# Patient Record
Sex: Female | Born: 1945 | Race: White | Hispanic: No | Smoking: Never smoker
Health system: Southern US, Community
[De-identification: ages and names within clinical notes are randomized; demographics above are authoritative.]

## PROBLEM LIST (undated history)

## (undated) DIAGNOSIS — F0280 Dementia in other diseases classified elsewhere without behavioral disturbance: Secondary | ICD-10-CM

## (undated) DIAGNOSIS — I10 Essential (primary) hypertension: Secondary | ICD-10-CM

## (undated) DIAGNOSIS — E7800 Pure hypercholesterolemia, unspecified: Secondary | ICD-10-CM

---

## 2019-04-29 ENCOUNTER — Other Ambulatory Visit: Payer: Self-pay

## 2019-04-29 ENCOUNTER — Emergency Department (HOSPITAL_BASED_OUTPATIENT_CLINIC_OR_DEPARTMENT_OTHER): Payer: Medicare Other

## 2019-04-29 ENCOUNTER — Emergency Department (HOSPITAL_BASED_OUTPATIENT_CLINIC_OR_DEPARTMENT_OTHER)
Admission: EM | Admit: 2019-04-29 | Discharge: 2019-04-29 | Disposition: A | Discharge status: Home / Self Care | Payer: Medicare Other | Attending: Emergency Medicine | Admitting: Emergency Medicine

## 2019-04-29 ENCOUNTER — Encounter (HOSPITAL_BASED_OUTPATIENT_CLINIC_OR_DEPARTMENT_OTHER): Payer: Self-pay | Admitting: Emergency Medicine

## 2019-04-29 DIAGNOSIS — I10 Essential (primary) hypertension: Secondary | ICD-10-CM | POA: Diagnosis not present

## 2019-04-29 DIAGNOSIS — G309 Alzheimer's disease, unspecified: Secondary | ICD-10-CM | POA: Insufficient documentation

## 2019-04-29 DIAGNOSIS — F0281 Dementia in other diseases classified elsewhere with behavioral disturbance: Secondary | ICD-10-CM | POA: Insufficient documentation

## 2019-04-29 DIAGNOSIS — R4689 Other symptoms and signs involving appearance and behavior: Secondary | ICD-10-CM

## 2019-04-29 DIAGNOSIS — R4182 Altered mental status, unspecified: Secondary | ICD-10-CM | POA: Diagnosis present

## 2019-04-29 HISTORY — DX: Essential (primary) hypertension: I10

## 2019-04-29 HISTORY — DX: Dementia in other diseases classified elsewhere, unspecified severity, without behavioral disturbance, psychotic disturbance, mood disturbance, and anxiety: F02.80

## 2019-04-29 HISTORY — DX: Pure hypercholesterolemia, unspecified: E78.00

## 2019-04-29 LAB — URINALYSIS, MICROSCOPIC (REFLEX): WBC, UA: NONE SEEN WBC/hpf (ref 0–5)

## 2019-04-29 LAB — CBC WITH DIFFERENTIAL/PLATELET
Abs Immature Granulocytes: 0.03 10*3/uL (ref 0.00–0.07)
Basophils Absolute: 0.1 10*3/uL (ref 0.0–0.1)
Basophils Relative: 1 %
Eosinophils Absolute: 0.1 10*3/uL (ref 0.0–0.5)
Eosinophils Relative: 1 %
HCT: 40.2 % (ref 36.0–46.0)
Hemoglobin: 12.9 g/dL (ref 12.0–15.0)
Immature Granulocytes: 0 %
Lymphocytes Relative: 34 %
Lymphs Abs: 3.2 10*3/uL (ref 0.7–4.0)
MCH: 29.7 pg (ref 26.0–34.0)
MCHC: 32.1 g/dL (ref 30.0–36.0)
MCV: 92.4 fL (ref 80.0–100.0)
Monocytes Absolute: 0.8 10*3/uL (ref 0.1–1.0)
Monocytes Relative: 9 %
Neutro Abs: 5.3 10*3/uL (ref 1.7–7.7)
Neutrophils Relative %: 55 %
Platelets: 236 10*3/uL (ref 150–400)
RBC: 4.35 MIL/uL (ref 3.87–5.11)
RDW: 13.2 % (ref 11.5–15.5)
WBC: 9.6 10*3/uL (ref 4.0–10.5)
nRBC: 0 % (ref 0.0–0.2)

## 2019-04-29 LAB — URINALYSIS, ROUTINE W REFLEX MICROSCOPIC
Bilirubin Urine: NEGATIVE
Glucose, UA: NEGATIVE mg/dL
Ketones, ur: NEGATIVE mg/dL
Leukocytes,Ua: NEGATIVE
Nitrite: NEGATIVE
Protein, ur: NEGATIVE mg/dL
Specific Gravity, Urine: 1.01 (ref 1.005–1.030)
pH: 7 (ref 5.0–8.0)

## 2019-04-29 LAB — VALPROIC ACID LEVEL: Valproic Acid Lvl: 32 ug/mL — ABNORMAL LOW (ref 50.0–100.0)

## 2019-04-29 LAB — COMPREHENSIVE METABOLIC PANEL
ALT: 15 U/L (ref 0–44)
AST: 18 U/L (ref 15–41)
Albumin: 3.9 g/dL (ref 3.5–5.0)
Alkaline Phosphatase: 68 U/L (ref 38–126)
Anion gap: 9 (ref 5–15)
BUN: 14 mg/dL (ref 8–23)
CO2: 26 mmol/L (ref 22–32)
Calcium: 9.3 mg/dL (ref 8.9–10.3)
Chloride: 107 mmol/L (ref 98–111)
Creatinine, Ser: 0.84 mg/dL (ref 0.44–1.00)
GFR calc Af Amer: >60 mL/min (ref >60)
GFR calc non Af Amer: >60 mL/min (ref >60)
Glucose, Bld: 130 mg/dL — ABNORMAL HIGH (ref 70–99)
Potassium: 4.2 mmol/L (ref 3.5–5.1)
Sodium: 142 mmol/L (ref 135–145)
Total Bilirubin: 0.6 mg/dL (ref 0.3–1.2)
Total Protein: 6.7 g/dL (ref 6.5–8.1)

## 2019-04-29 LAB — AMMONIA: Ammonia: 15 umol/L (ref 9–35)

## 2019-04-29 MED ORDER — ZIPRASIDONE MESYLATE 20 MG IM SOLR
10.0000 mg | Freq: Once | INTRAMUSCULAR | Status: AC
  Administered 2019-04-29: 10 mg via INTRAMUSCULAR
  Filled 2019-04-29: qty 20

## 2019-04-29 MED ORDER — LORAZEPAM 2 MG/ML IJ SOLN
1.0000 mg | Freq: Once | INTRAMUSCULAR | Status: AC
  Administered 2019-04-29: 1 mg via INTRAMUSCULAR
  Filled 2019-04-29: qty 1

## 2019-04-29 NOTE — ED Notes (Signed)
Contacted [facility] for transport to [facility] @ [address] [place]  [phone]

## 2019-04-29 NOTE — ED Notes (Signed)
Confused, alert, placed 1:1 with charge nurse, while waiting for room. Smiling, given crackers and po fluids, ED MD assessed

## 2019-04-29 NOTE — ED Triage Notes (Signed)
Pt arrived via EMS from memory care unit. Staff reported pt was acting aggressive toward staff and hitting her head against the wall. Pt is calm and cooperative at this time. Hx of dementia.

## 2019-04-29 NOTE — ED Notes (Signed)
Pts family member Mariane Duval verbalized understanding of d/c instructions via phone

## 2019-04-29 NOTE — ED Provider Notes (Signed)
[facility] EMERGENCY DEPARTMENT Provider Note   CSN: [id number] Arrival date & time: 04/29/19  1622     History Chief Complaint  Patient presents with  . Altered Mental Status    Sonya Porter is a 74 y.o. female.  Patient brought from assisted living facility by EMS with aggressive behavior.  She is unable to give a history.  Staff reported patient was acting aggressive towards staff and hitting her head against the wall.  She does have a history of dementia.  Discussed with [facility] staff cholesterol.  Parents.  Patient became acutely aggressive and agitated and combative with staff during diaper change.  Patient was throwing her excrement around the facility and hitting her head on the wall.  She did not lose consciousness.  She was apparently more agitated than usual.  Staff reports that she does have a history of dementia and takes Namenda and donepezil.  She also has recent started on Depakote.  Other medications with loratadine, Lexapro, pravastatin and lisinopril.  No recent fever.  No vomiting or diarrhea. She is at her mental baseline on arrival with her baseline level of confusion.  The history is provided by the patient, a caregiver and the EMS personnel. The history is limited by the condition of the patient.  Altered Mental Status Progression:  Waxing and waning      Past Medical History:  Diagnosis Date  . Alzheimer's dementia (HCC)   . High cholesterol   . Hypertension     There are no problems to display for this patient.   History reviewed. No pertinent surgical history.   OB History   No obstetric history on file.     No family history on file.  Social History   Tobacco Use  . Smoking status: Never Smoker  . Smokeless tobacco: Never Used  Substance Use Topics  . Alcohol use: Not on file  . Drug use: Not on file    Home Medications Prior to Admission medications   Not on File    Allergies    Codeine and Penicillins  Review of  Systems   Review of Systems  Unable to perform ROS: Dementia    Physical Exam Updated Vital Signs BP (!) 114/59 (BP Location: Left Arm)   Pulse 62   Temp 99 F (37.2 C) (Oral)   Resp 18   SpO2 98%   Physical Exam Vitals and nursing note reviewed.  Constitutional:      General: She is not in acute distress.    Appearance: She is well-developed.     Comments: Confused, laughing, does not answer questions appropriately, no distress  HENT:     Head: Normocephalic and atraumatic.     Comments: No evidence of head injury.  No septal hematoma or hemotympanum    Mouth/Throat:     Pharynx: No oropharyngeal exudate.  Eyes:     Conjunctiva/sclera: Conjunctivae normal.     Pupils: Pupils are equal, round, and reactive to light.  Neck:     Comments: No C-spine tenderness Cardiovascular:     Rate and Rhythm: Normal rate and regular rhythm.     Heart sounds: Normal heart sounds. No murmur.  Pulmonary:     Effort: Pulmonary effort is normal. No respiratory distress.     Breath sounds: Normal breath sounds.  Chest:     Chest wall: No tenderness.  Abdominal:     Palpations: Abdomen is soft.     Tenderness: There is no abdominal tenderness. There is  no guarding or rebound.  Musculoskeletal:        General: No tenderness. Normal range of motion.     Cervical back: Normal range of motion and neck supple.  Skin:    General: Skin is warm.  Neurological:     Mental Status: She is alert.     Motor: No abnormal muscle tone.     Comments: Smiling interactive, moves all extremities, does not answer questions appropriately or follow commands.  Psychiatric:        Behavior: Behavior normal.     ED Results / Procedures / Treatments   Labs (all labs ordered are listed, but only abnormal results are displayed) Labs Reviewed  COMPREHENSIVE METABOLIC PANEL - Abnormal; Notable for the following components:      Result Value   Glucose, Bld 130 (*)    All other components within normal limits   URINALYSIS, ROUTINE W REFLEX MICROSCOPIC - Abnormal; Notable for the following components:   Hgb urine dipstick TRACE (*)    All other components within normal limits  VALPROIC ACID LEVEL - Abnormal; Notable for the following components:   Valproic Acid Lvl 32 (*)    All other components within normal limits  URINALYSIS, MICROSCOPIC (REFLEX) - Abnormal; Notable for the following components:   Bacteria, UA RARE (*)    All other components within normal limits  URINE CULTURE  CBC WITH DIFFERENTIAL/PLATELET  AMMONIA    EKG None  Radiology No results found.  Procedures Procedures (including critical care time)  Medications Ordered in ED Medications - No data to display  ED Course  I have reviewed the triage vital signs and the nursing notes.  Pertinent labs & imaging results that were available during my care of the patient were reviewed by me and considered in my medical decision making (see chart for details).    MDM Rules/Calculators/A&P                     Patient from facility with aggressive behavior hitting her head on the wall.  On arrival she is calm and cooperative.  She is unable to give a history.  Discussed with nursing home staff as above.  States she was recently started on Depakote.  She does not take any blood thinners.  Head is negative.  Chest x-ray is negative.  Labs are reassuring with subtherapeutic Depakote level.  Urinalysis is negative.  Patient has been calm and cooperative throughout her ED stay.  She was given some Ativan to obtain head CT. Her work-up is reassuring and stable.  She has stable labs, chest x-ray-continue.  Urinalysis is negative.  She appears stable to return to her facility. Final Clinical Impression(s) / ED Diagnoses Final diagnoses:  Aggressive behavior    Rx / DC Orders ED Discharge Orders    None       Vani Gunner, Annie Main, MD 04/29/19 2326

## 2019-04-29 NOTE — ED Notes (Signed)
1:1 sitter at bedside for safety observation

## 2019-04-29 NOTE — ED Notes (Addendum)
Per daughter Mariane Duval, pt does have hx of aggression, however the aggression reported today was not pts normal. Pt began taking depakote approx 1.5 weeks ago.

## 2019-04-29 NOTE — ED Notes (Signed)
Spoke with Aundria Mems Drame who stated no RN avail to give report. This RN informed Ellan Lambert that pt will be transported bact to [facility] this evening via [facility]. [id number]

## 2019-04-29 NOTE — Discharge Instructions (Signed)
Labs are normal, urinalysis and chest x-ray are negative.  CT head is stable.  Follow-up with your doctor.  Return to the ED with new or worsening symptoms.

## 2019-04-29 NOTE — ED Notes (Signed)
RN remains 1:1, due to confusion, walking around nursing station

## 2019-04-29 NOTE — ED Notes (Signed)
Sitter at bedside.

## 2019-04-29 NOTE — ED Notes (Signed)
Pt slightly drowsy following medication, easily rousable to verbal.

## 2019-05-01 LAB — URINE CULTURE: Culture: NO GROWTH

## 2019-11-21 ENCOUNTER — Encounter (HOSPITAL_BASED_OUTPATIENT_CLINIC_OR_DEPARTMENT_OTHER): Payer: Self-pay | Admitting: Emergency Medicine

## 2019-11-21 ENCOUNTER — Other Ambulatory Visit: Payer: Self-pay

## 2019-11-21 ENCOUNTER — Emergency Department (HOSPITAL_BASED_OUTPATIENT_CLINIC_OR_DEPARTMENT_OTHER)
Admission: EM | Admit: 2019-11-21 | Discharge: 2019-11-21 | Disposition: A | Discharge status: Skilled Nursing Facility | Payer: Medicare Other | Attending: Emergency Medicine | Admitting: Emergency Medicine

## 2019-11-21 ENCOUNTER — Emergency Department (HOSPITAL_BASED_OUTPATIENT_CLINIC_OR_DEPARTMENT_OTHER): Payer: Medicare Other

## 2019-11-21 DIAGNOSIS — F0390 Unspecified dementia without behavioral disturbance: Secondary | ICD-10-CM | POA: Diagnosis not present

## 2019-11-21 DIAGNOSIS — I10 Essential (primary) hypertension: Secondary | ICD-10-CM | POA: Diagnosis not present

## 2019-11-21 DIAGNOSIS — M545 Low back pain, unspecified: Secondary | ICD-10-CM

## 2019-11-21 DIAGNOSIS — Z79899 Other long term (current) drug therapy: Secondary | ICD-10-CM | POA: Insufficient documentation

## 2019-11-21 DIAGNOSIS — Z880 Allergy status to penicillin: Secondary | ICD-10-CM | POA: Insufficient documentation

## 2019-11-21 NOTE — ED Provider Notes (Signed)
[facility] EMERGENCY DEPARTMENT Provider Note   CSN: [id number] Arrival date & time: 11/21/19  1904     History Chief Complaint  Patient presents with  . Back Pain    Sonya Porter is a 74 y.o. female.  Patient is a 74 year old female with history of dementia and hypertension.  She is sent from her extended care facility for evaluation of possible fall.  From what I am told, patient has been having back pain in the nursing home facility believes she may have fallen.  Patient adds no additional history secondary to dementia.  She denies to me that anything hurts.  The history is provided by the patient.       Past Medical History:  Diagnosis Date  . Alzheimer's dementia (HCC)   . High cholesterol   . Hypertension     There are no problems to display for this patient.   No past surgical history on file.   OB History   No obstetric history on file.     No family history on file.  Social History   Tobacco Use  . Smoking status: Never Smoker  . Smokeless tobacco: Never Used  Substance Use Topics  . Alcohol use: Not on file  . Drug use: Not on file    Home Medications Prior to Admission medications   Medication Sig Start Date End Date Taking? Authorizing Provider  betamethasone dipropionate 0.05 % cream Apply topically 2 (two) times daily.    [provider]  divalproex (DEPAKOTE ER) 250 MG 24 hr tablet Take 250 mg by mouth daily.    [provider]  donepezil (ARICEPT) 10 MG tablet Take 10 mg by mouth at bedtime.    [provider]  escitalopram (LEXAPRO) 10 MG tablet Take 10 mg by mouth daily.    [provider]  lisinopril (ZESTRIL) 10 MG tablet Take 10 mg by mouth daily.    [provider]  loratadine (CLARITIN) 10 MG tablet Take 10 mg by mouth daily.    [provider]  memantine (NAMENDA) 5 MG tablet Take 5 mg by mouth daily.    [provider]  pravastatin (PRAVACHOL) 10 MG tablet  Take 10 mg by mouth daily.    [provider]    Allergies    Codeine and Penicillins  Review of Systems   Review of Systems  Unable to perform ROS: Dementia    Physical Exam Updated Vital Signs BP 117/68 (BP Location: Right Arm)   Pulse 68   Temp 99.7 F (37.6 C) (Axillary)   Resp 16   SpO2 100%   Physical Exam Vitals and nursing note reviewed.  Constitutional:      General: She is not in acute distress.    Appearance: She is well-developed. She is not diaphoretic.  HENT:     Head: Normocephalic and atraumatic.  Cardiovascular:     Rate and Rhythm: Normal rate and regular rhythm.     Heart sounds: No murmur heard.  No friction rub. No gallop.   Pulmonary:     Effort: Pulmonary effort is normal. No respiratory distress.     Breath sounds: Normal breath sounds. No wheezing.  Abdominal:     General: Bowel sounds are normal. There is no distension.     Palpations: Abdomen is soft.     Tenderness: There is no abdominal tenderness.  Musculoskeletal:        General: Normal range of motion.     Cervical back:  Normal range of motion and neck supple.     Comments: The thoracic and lumbar back appear grossly normal.  There is no bruising or abrasion.  There is no bony tenderness or step-off.  Skin:    General: Skin is warm and dry.  Neurological:     Mental Status: She is alert and oriented to person, place, and time.     Comments: Neurologic exam is difficult secondary to dementia and lack of cooperation.  She does move all extremities and appears neurologically intact.     ED Results / Procedures / Treatments   Labs (all labs ordered are listed, but only abnormal results are displayed) Labs Reviewed - No data to display  EKG None  Radiology No results found.  Procedures Procedures (including critical care time)  Medications Ordered in ED Medications - No data to display  ED Course  I have reviewed the triage vital signs and the nursing  notes.  Pertinent labs & imaging results that were available during my care of the patient were reviewed by me and considered in my medical decision making (see chart for details).    MDM Rules/Calculators/A&P  CT scan of the lumbar region negative for fracture.    History difficult to obtain from patient secondary to dementia, but she appears well.  She is moving without difficulty and there are no signs that she is discomfort.    She will discharged, to return/follow up as needed.  Final Clinical Impression(s) / ED Diagnoses Final diagnoses:  None    Rx / DC Orders ED Discharge Orders    None       Geoffery Lyons, MD 11/21/19 2129

## 2019-11-21 NOTE — Discharge Instructions (Addendum)
Continue medications as previously prescribed.  Follow-up with primary doctor if not improving in the next week, and return to the ER if symptoms significantly worsen or change.

## 2019-11-21 NOTE — ED Notes (Signed)
Gave Pt. Son information on Pt. And discharge info so the son could go home.

## 2019-11-21 NOTE — ED Triage Notes (Addendum)
Per EMS:  Pt from Nursing home.  Possible fall.  Pt having back pain per staff at home.  Pt in bed at home since 3 pm.  Pt becomes hostile when attempting palpation.

## 2019-11-21 NOTE — ED Notes (Signed)
Spoke to [place] at Nursing home to inform her of the Discharge instructions and of the results.  No new meds for the Pt.

## 2019-11-21 NOTE — ED Notes (Signed)
In to assess the Pt. Noted Pt. Does not speak clear sentences and makes no much sense when she speaks.  No noted bruising on Pt. Back where she is saying she has pain.

## 2020-03-27 DEATH — deceased

## 2022-03-24 IMAGING — CT CT L SPINE W/O CM
3 series · 12 of 33 positions shown, 14 images · non-contrast
Comparison: None.

CLINICAL DATA: Lower back pain after fall

EXAM:
CT LUMBAR SPINE WITHOUT CONTRAST
TECHNIQUE: Multidetector CT imaging of the lumbar spine was performed without
intravenous contrast administration. Multiplanar CT image
reconstructions were also generated.

[Series 5: l spine soft · axial · 0.35mm/px · z∈[-384,-216]mm · 4 of 122 slices shown, 5 images]
[im 19/122  soft-tissue]
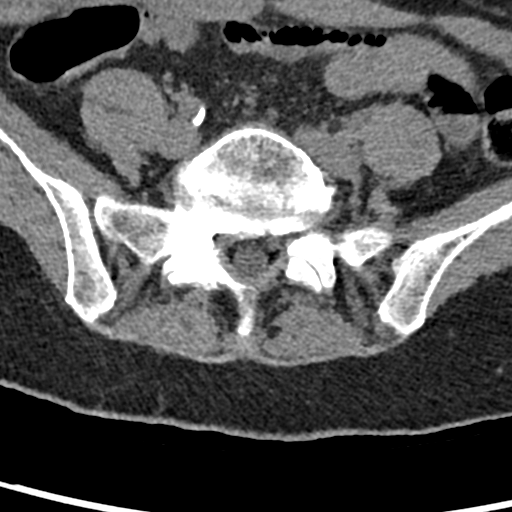
[im 19/122  bone]
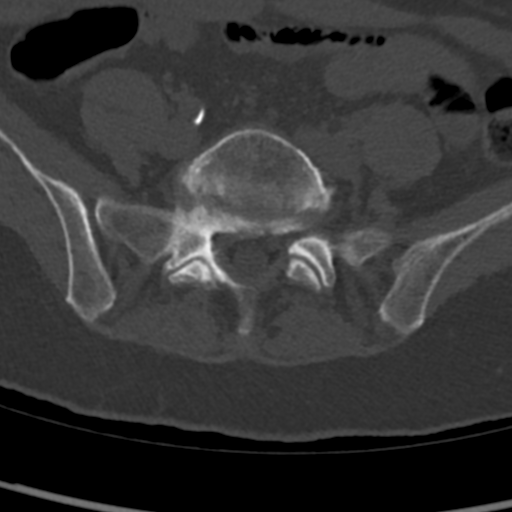
[im 47/122  bone]
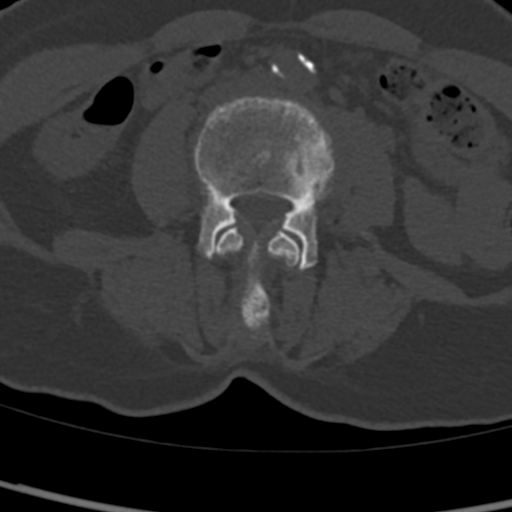
[im 75/122  bone]
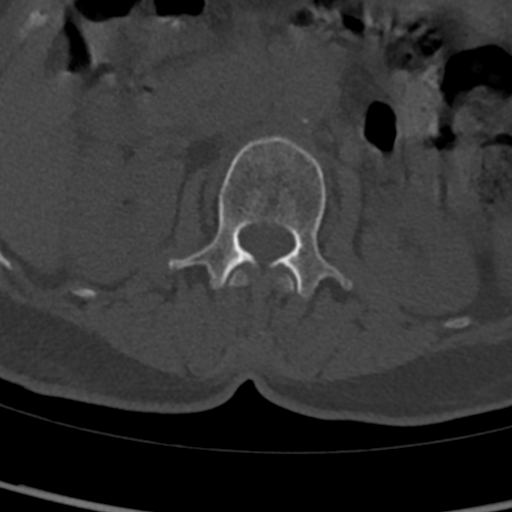
[im 103/122  bone]
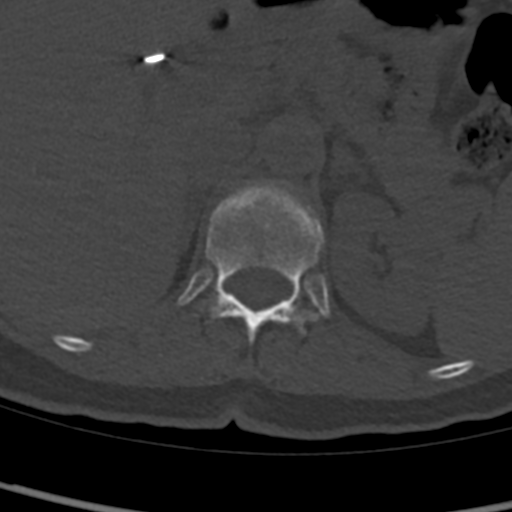

[Series 8: sagittal bone · sagittal · 0.30mm/px · 5 of 65 slices shown, 6 images]
[im 22/65  bone]
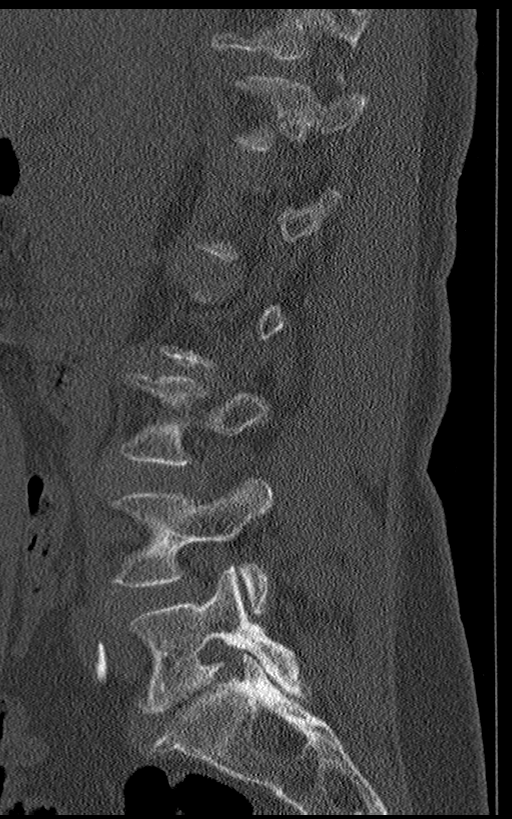
[im 27/65  bone]
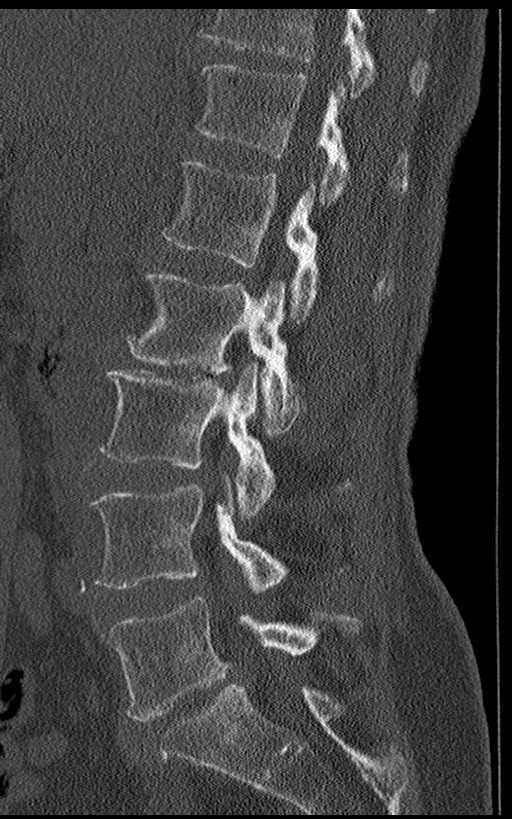
[im 33/65  soft-tissue]
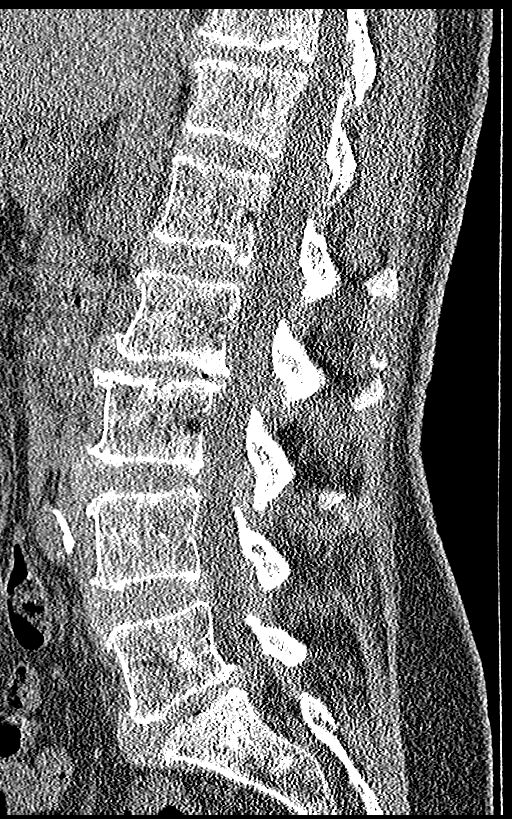
[im 33/65  bone]
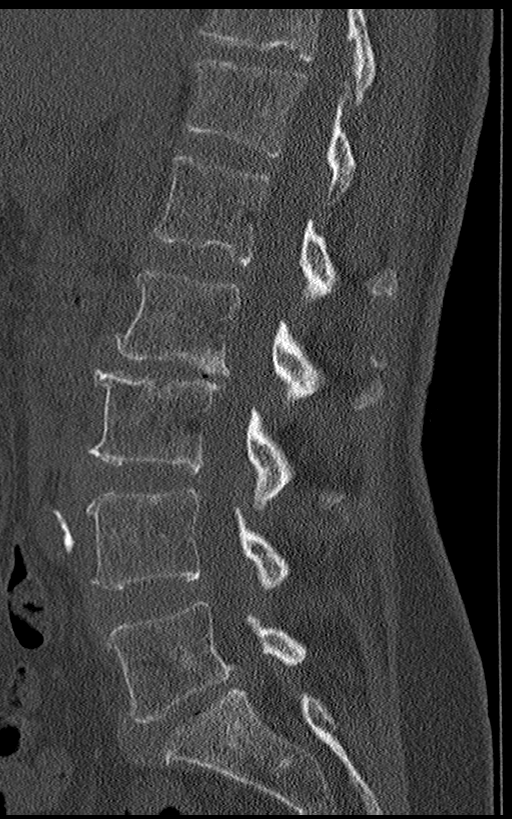
[im 38/65  bone]
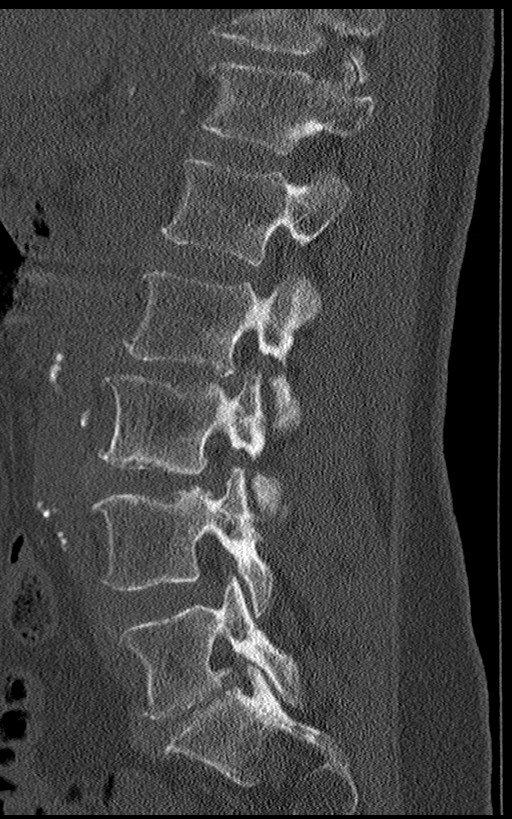
[im 43/65  bone]
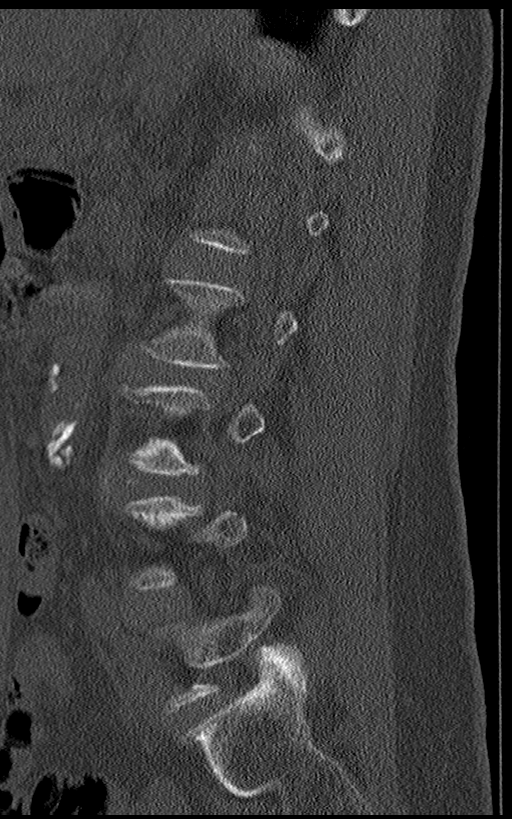

[Series 9: coronal bone · coronal · 0.27mm/px · 3 of 69 slices shown]
[im 14/69  bone]
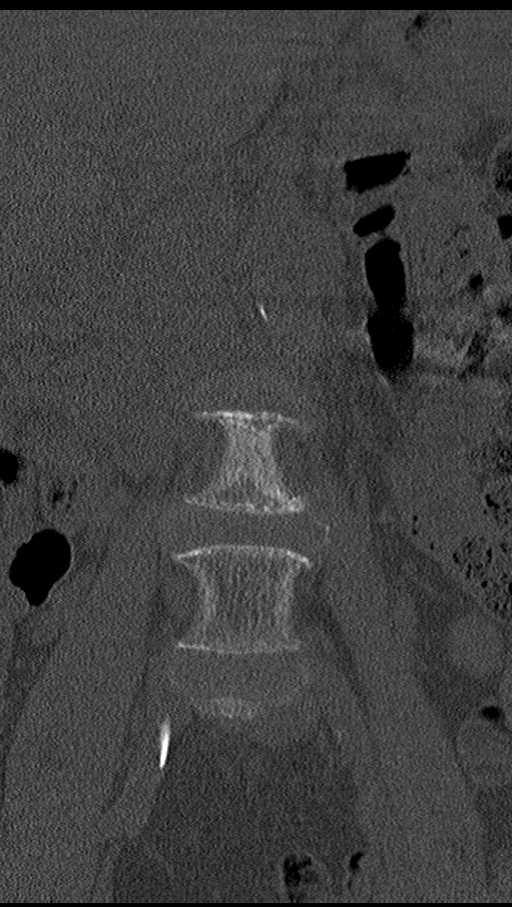
[im 28/69  bone]
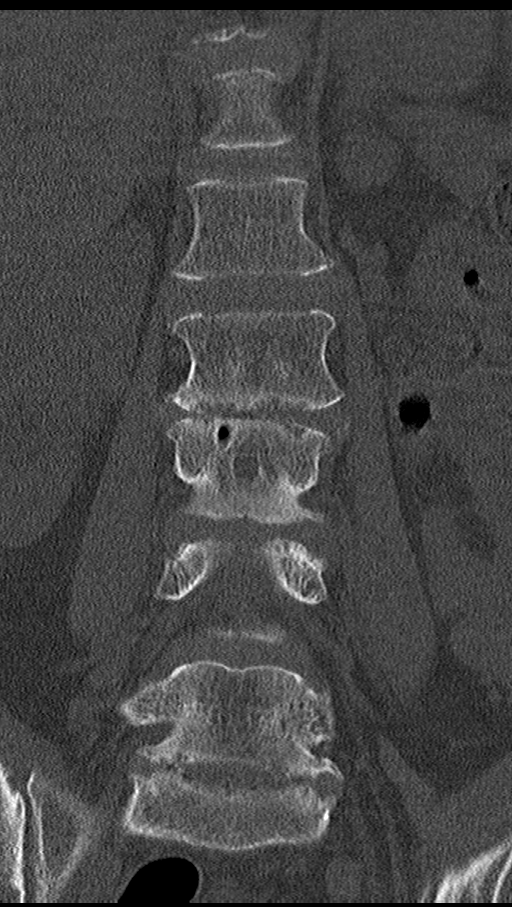
[im 41/69  bone]
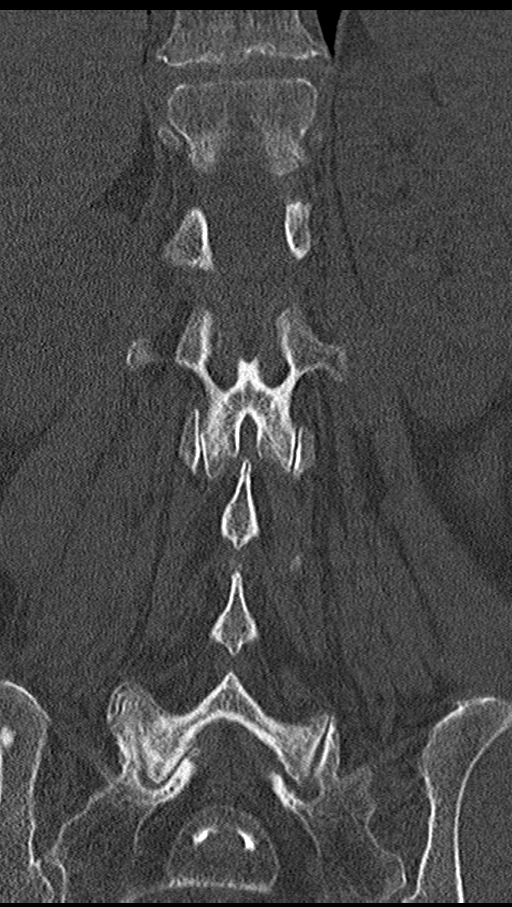

[12 of 33 positions shown; findings below may reference images not displayed]

FINDINGS: Segmentation: There are 5 non-rib bearing lumbar type vertebral
bodies with the last intervertebral disc space labeled as L5-S1.

Alignment: There is a minimal retrolisthesis of L2 on L3.

Vertebrae: The vertebral body heights are well maintained. No
fracture, malalignment, or pathologic osseous lesions seen.

Paraspinal and other soft tissues: The paraspinal soft tissues and
visualized retroperitoneal structures are unremarkable. The
sacroiliac joints are intact.

Disc levels: Mild disc height loss with broad-based disc bulge and
facet arthrosis is most notable L2-L3 with moderate neural foraminal
narrowing and mild central canal stenosis and L5-S1 with a central
disc protrusion and facet arthrosis which causes moderate foraminal
narrowing central stenosis.
IMPRESSION: No acute fracture or malalignment.
